# Patient Record
Sex: Female | Born: 1970
Health system: Southern US, Community
[De-identification: ages and names within clinical notes are randomized; demographics above are authoritative.]

---

## 1999-12-08 ENCOUNTER — Other Ambulatory Visit: Admission: RE | Admit: 1999-12-08 | Discharge: 1999-12-08 | Payer: Self-pay | Admitting: *Deleted

## 2000-06-26 ENCOUNTER — Inpatient Hospital Stay (HOSPITAL_COMMUNITY): Admission: AD | Admit: 2000-06-26 | Discharge: 2000-06-29 | Payer: Self-pay | Admitting: Obstetrics and Gynecology

## 2004-02-09 ENCOUNTER — Other Ambulatory Visit: Admission: RE | Admit: 2004-02-09 | Discharge: 2004-02-09 | Payer: Self-pay | Admitting: Obstetrics and Gynecology

## 2004-09-29 ENCOUNTER — Ambulatory Visit (HOSPITAL_COMMUNITY): Admission: RE | Admit: 2004-09-29 | Discharge: 2004-09-29 | Payer: Self-pay | Admitting: Obstetrics and Gynecology

## 2005-03-01 ENCOUNTER — Inpatient Hospital Stay (HOSPITAL_COMMUNITY): Admission: AD | Admit: 2005-03-01 | Discharge: 2005-03-03 | Payer: Self-pay | Admitting: Obstetrics and Gynecology

## 2005-04-06 ENCOUNTER — Other Ambulatory Visit: Admission: RE | Admit: 2005-04-06 | Discharge: 2005-04-06 | Payer: Self-pay | Admitting: Obstetrics and Gynecology

## 2006-04-09 ENCOUNTER — Other Ambulatory Visit: Admission: RE | Admit: 2006-04-09 | Discharge: 2006-04-09 | Payer: Self-pay | Admitting: Obstetrics and Gynecology

## 2007-05-14 ENCOUNTER — Other Ambulatory Visit: Admission: RE | Admit: 2007-05-14 | Discharge: 2007-05-14 | Payer: Self-pay | Admitting: *Deleted

## 2007-05-26 ENCOUNTER — Encounter: Admission: RE | Admit: 2007-05-26 | Discharge: 2007-05-26 | Payer: Self-pay | Admitting: *Deleted

## 2009-03-01 ENCOUNTER — Other Ambulatory Visit: Admission: RE | Admit: 2009-03-01 | Discharge: 2009-03-01 | Payer: Self-pay | Admitting: Family Medicine

## 2009-11-02 ENCOUNTER — Encounter
Admission: RE | Admit: 2009-11-02 | Discharge: 2009-11-02 | Payer: Self-pay | Source: Home / Self Care | Admitting: Family Medicine

## 2010-11-07 ENCOUNTER — Other Ambulatory Visit: Payer: Self-pay | Admitting: Family Medicine

## 2010-11-07 DIAGNOSIS — Z1231 Encounter for screening mammogram for malignant neoplasm of breast: Secondary | ICD-10-CM

## 2010-11-15 ENCOUNTER — Ambulatory Visit
Admission: RE | Admit: 2010-11-15 | Discharge: 2010-11-15 | Disposition: A | Discharge status: Home / Self Care | Payer: BC Managed Care – PPO | Source: Ambulatory Visit | Attending: Family Medicine | Admitting: Family Medicine

## 2010-11-15 DIAGNOSIS — Z1231 Encounter for screening mammogram for malignant neoplasm of breast: Secondary | ICD-10-CM

## 2011-02-16 NOTE — H&P (Signed)
[facility]  Patient:    Anna Howard, Anna Howard                         MRN: [id number] Adm. Date:  [id number] Attending:  Cleatrice Burke Dictator:   Vance Gather Duplantis, C.N.M.                         History and Physical  HISTORY OF PRESENT ILLNESS:   Anna Howard is a 40 year old married white female gravida I, para 0 at 38.[redacted] weeks gestation who presents complaining of contractions every two minutes for the last four hours and rupture of the membranes two hours prior to that.  She reports that the fluid was clear and she has had some bloody show.  She is currently complaining of an urge push as she arrived at the hospital.  Her pregnancy has been followed at [facility] by the Certified Nurse Midwife Service and has been essentially uncomplicated.  Her group B Strep is negative.  OB-GYN HISTORY:               She is a gravida I, para 0 with no Gyn complications.  GENERAL MEDICAL HISTORY:      She has no known drug allergies.  She reports having had the usual childhood diseases.  She reports and episode of a kidney infection as a child, but did not require hospitalization.  Her only surgery was to have her wisdom teeth removed.  FAMILY HISTORY:               Significant for father and mother with hypertension requiring medication.  Father with thyroidectomy at age 40 now on Synthroid.  Paternal uncle with liver cancer.  Maternal grandmother with Alzheimers.  GENETIC HISTORY:              Negative.  SOCIAL HISTORY:               She is married to [employer] who is involved and supportive.  They are of the [place] faith.  They deny any illicit drug use, alcohol or smoking with this pregnancy.  PRENATAL LABS:                Her blood type is A positive.  Her antibody screen is negative.  Syphilis is nonreactive.  Rubella is immune.  Hepatitis B surface antigen is negative.  HIV is nonreactive.  GC and Chlamydia are both negative.  Pap  is within normal limits.  Her one-hour Glucola was 66.  She declined a maternal serum alpha fetoprotein and her 36-week beta Strep was negative.  PHYSICAL EXAMINATION:  VITAL SIGNS:                  Stable.  She is afebrile.  HEENT:                        Grossly within normal limits.  HEART:                        Regular rhythm and rate.  CHEST:                        Clear.  BREASTS:                      Soft and nontender.  ABDOMEN:  Gravid with uterine contractions every two minutes.  Fetal heart rate is reassuring with some early decelerations.  PELVIC EXAM ON ADMISSION:     Eight centimeters, 90% effaced, vertex at a zero station.  EXTREMITIES:                  Within normal limits.  ASSESSMENT:                   1. Intrauterine pregnancy at term.                               2. Transition stage of labor.                               3. Negative group B Streptococcus.  PLAN:                         1. Admit to labor and delivery at [facility].                               2. Patient desires a nonintervention delivery                                  and labor.                               3. Notify Dr. Cleatrice Burke of patients                                  admission. DD:  06/27/00 TD:  06/27/00 Job: [id number]

## 2011-02-16 NOTE — H&P (Signed)
NAMEARIYEL, JEANGILLES                ACCOUNT NO.:  [id number]   MEDICAL RECORD NO.:  [id number]          PATIENT TYPE:  INP   LOCATION:  9111                          FACILITY:  [facility]   PHYSICIAN:  Naima A. Dillard, M.D. DATE OF BIRTH:  22-Apr-1971   DATE OF ADMISSION:  03/01/2005  DATE OF DISCHARGE:                                HISTORY & PHYSICAL   HISTORY OF PRESENT ILLNESS:  Ms. Anna Howard is a 40 year old married white  female, gravida 3, para 2-0-0-2 at 40-0/7 weeks who presents with leaking  fluids since midnight and regular contractions since then.  She denies  bleeding, headache, nausea, or visual disturbances.  Her pregnancy has been  followed by [facility] certified nurse midwife service and has  been remarkable for: 1) Advanced maternal age with no genetic testing.  2)  First trimester bleeding.  3) Group B strep negative.   Her prenatal labs were collected on August 09, 2004.  Hemoglobin 11.8,  hematocrit 35.3, platelets 223,000.  Blood type A positive.  Antibody  negative.  RPR nonreactive.  Rubella immune.  Hepatitis B surface antigen  negative.  HIV nonreactive.  Pap smear normal.  Gonorrhea negative.  Chlamydia negative.  One hour Glucola on December 01, 2004, was 86 and RPR at  that time was nonreactive.  Hemoglobin at that time was 10.6.  Culture of  the vaginal track for group B strep on January 19, 2005, was negative.   HISTORY OF PRESENT PREGNANCY:  The patient presented for care at [facility] on August 09, 2004, at 10-5/7 weeks' gestation.  The patient had  an episode of bleeding two days after that first visit.  Heart tones were  still audible in the 160s.  Pregnancy ultrasonography on October 26, 2004,  at [facility] in [place] showed growth consistent with previous  dating and confirmed and [facility] of March 01, 2005 with no anatomic abnormalities  seen.  She declined genetic testing.  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  Those lab results just  came back today.   PAST OBSTETRICAL HISTORY:  She is a gravida 3, para 2-0-0-2.  In September  2001, she had a vaginal delivery of a female infant, weighing 8 pounds at 56  weeks' gestation after six hours of labor.  His name is Chrissie Noa.  In July  2003, she had a vaginal delivery of a female infant weighing 7 pounds and 11  ounces at 40 weeks' gestation after six hours of labor.  She was exposed to  __________ in the first trimester.  That infant was born in [place].   ALLERGIES:  She has no medication allergies.   PAST MEDICAL HISTORY:  She experienced menarche at the age of 27 with 26 day  intervals lasting five days.  She reports having had the usual childhood  illnesses.  She had pyelonephritis in childhood one time.   FAMILY MEDICAL HISTORY:  Remarkable for mother and maternal grandmother with  varicosities.  Father with thyroid dysfunction.  Father with chronic renal  disease.  Maternal grandmother with Alzheimer's.  Maternal uncle with liver  and lung cancer.   PAST SURGICAL HISTORY:  Remarkable for wisdom teeth extraction in 1994.   GENETIC HISTORY:  Remarkable for patient being at the age of 60.  It is  otherwise negative.   SOCIAL HISTORY:  The patient is married to the father of the baby.  His name  is Elige Radon.  He is involved and supportive.  There are of the [place]  faith.  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  Father of  the baby has a Education administrator and is employed in Airline pilot at [employer].  They  deny any alcohol, tobacco or illicit drug use with this pregnancy.   PHYSICAL EXAMINATION:  VITAL SIGNS:  Stable.  She is afebrile.  HEENT:  Grossly within normal limits.  CHEST:  Clear to auscultation.  HEART:  Regular rate and rhythm.  ABDOMEN:  Gravid in contour with fundal height extending approximately 40 cm  up to the pubic  symphysis.  Fetal heart rate is reassuring with positive  accelerations and no decelerations.  Negative CST.  Contractions every 3-4  minutes.  PELVIC:  Cervix is 5-6 cm, 80%, vertex -1 on exam with clear fluid present.  EXTREMITIES:  Normal.   IMPRESSION:  1.  Intrauterine pregnancy at term.  2.  Active labor.  3.  Group B strep negative.   PLAN:  1.  Admit to birthing suite.  Dr. Normand Sloop has been notified.  2.  Routine CNM orders.  3.  Patient declined pain medication.  4.  Anticipate normal spontaneous vaginal birth.       KS/MEDQ  D:  03/01/2005  T:  03/01/2005  Job:  [id number]

## 2011-07-25 ENCOUNTER — Other Ambulatory Visit (HOSPITAL_COMMUNITY)
Admission: RE | Admit: 2011-07-25 | Discharge: 2011-07-25 | Disposition: A | Discharge status: Home / Self Care | Payer: BC Managed Care – PPO | Source: Ambulatory Visit | Attending: Family Medicine | Admitting: Family Medicine

## 2011-07-25 ENCOUNTER — Other Ambulatory Visit: Payer: Self-pay | Admitting: Physician Assistant

## 2011-07-25 DIAGNOSIS — Z01419 Encounter for gynecological examination (general) (routine) without abnormal findings: Secondary | ICD-10-CM | POA: Insufficient documentation

## 2011-10-16 ENCOUNTER — Other Ambulatory Visit: Payer: Self-pay | Admitting: Family Medicine

## 2011-10-16 DIAGNOSIS — Z1231 Encounter for screening mammogram for malignant neoplasm of breast: Secondary | ICD-10-CM

## 2011-11-19 ENCOUNTER — Institutional Professional Consult (permissible substitution): Payer: BC Managed Care – PPO | Admitting: Family Medicine

## 2011-11-19 ENCOUNTER — Ambulatory Visit
Admission: RE | Admit: 2011-11-19 | Discharge: 2011-11-19 | Disposition: A | Discharge status: Home / Self Care | Payer: PRIVATE HEALTH INSURANCE | Source: Ambulatory Visit | Attending: Family Medicine | Admitting: Family Medicine

## 2011-11-19 ENCOUNTER — Ambulatory Visit: Payer: BC Managed Care – PPO | Admitting: Family Medicine

## 2011-11-19 ENCOUNTER — Other Ambulatory Visit: Payer: Self-pay | Admitting: Family Medicine

## 2011-11-19 DIAGNOSIS — Z1231 Encounter for screening mammogram for malignant neoplasm of breast: Secondary | ICD-10-CM

## 2012-08-14 ENCOUNTER — Other Ambulatory Visit: Payer: Self-pay | Admitting: Family Medicine

## 2012-08-14 ENCOUNTER — Other Ambulatory Visit (HOSPITAL_COMMUNITY)
Admission: RE | Admit: 2012-08-14 | Discharge: 2012-08-14 | Disposition: A | Discharge status: Home / Self Care | Payer: PRIVATE HEALTH INSURANCE | Source: Ambulatory Visit | Attending: Family Medicine | Admitting: Family Medicine

## 2012-08-14 DIAGNOSIS — Z01419 Encounter for gynecological examination (general) (routine) without abnormal findings: Secondary | ICD-10-CM | POA: Insufficient documentation

## 2012-11-27 ENCOUNTER — Other Ambulatory Visit: Payer: Self-pay

## 2012-12-22 ENCOUNTER — Ambulatory Visit
Admission: RE | Admit: 2012-12-22 | Discharge: 2012-12-22 | Disposition: A | Discharge status: Home / Self Care | Payer: PRIVATE HEALTH INSURANCE | Source: Ambulatory Visit

## 2012-12-22 DIAGNOSIS — Z1231 Encounter for screening mammogram for malignant neoplasm of breast: Secondary | ICD-10-CM

## 2013-12-01 ENCOUNTER — Other Ambulatory Visit: Payer: Self-pay

## 2013-12-01 DIAGNOSIS — Z1231 Encounter for screening mammogram for malignant neoplasm of breast: Secondary | ICD-10-CM

## 2013-12-23 ENCOUNTER — Ambulatory Visit
Admission: RE | Admit: 2013-12-23 | Discharge: 2013-12-23 | Disposition: A | Discharge status: Home / Self Care | Payer: PRIVATE HEALTH INSURANCE | Source: Ambulatory Visit

## 2013-12-23 DIAGNOSIS — Z1231 Encounter for screening mammogram for malignant neoplasm of breast: Secondary | ICD-10-CM

## 2013-12-24 ENCOUNTER — Ambulatory Visit: Payer: PRIVATE HEALTH INSURANCE

## 2013-12-30 ENCOUNTER — Other Ambulatory Visit: Payer: Self-pay | Admitting: Family Medicine

## 2013-12-30 DIAGNOSIS — R928 Other abnormal and inconclusive findings on diagnostic imaging of breast: Secondary | ICD-10-CM

## 2014-01-07 ENCOUNTER — Ambulatory Visit
Admission: RE | Admit: 2014-01-07 | Discharge: 2014-01-07 | Disposition: A | Discharge status: Home / Self Care | Payer: Self-pay | Source: Ambulatory Visit | Attending: Family Medicine | Admitting: Family Medicine

## 2014-01-07 ENCOUNTER — Ambulatory Visit
Admission: RE | Admit: 2014-01-07 | Discharge: 2014-01-07 | Disposition: A | Discharge status: Home / Self Care | Payer: PRIVATE HEALTH INSURANCE | Source: Ambulatory Visit | Attending: Family Medicine | Admitting: Family Medicine

## 2014-01-07 DIAGNOSIS — R928 Other abnormal and inconclusive findings on diagnostic imaging of breast: Secondary | ICD-10-CM

## 2015-02-01 ENCOUNTER — Other Ambulatory Visit: Payer: Self-pay

## 2015-02-01 DIAGNOSIS — Z1231 Encounter for screening mammogram for malignant neoplasm of breast: Secondary | ICD-10-CM

## 2015-02-17 ENCOUNTER — Ambulatory Visit
Admission: RE | Admit: 2015-02-17 | Discharge: 2015-02-17 | Disposition: A | Discharge status: Home / Self Care | Payer: PRIVATE HEALTH INSURANCE | Source: Ambulatory Visit

## 2015-02-17 DIAGNOSIS — Z1231 Encounter for screening mammogram for malignant neoplasm of breast: Secondary | ICD-10-CM

## 2015-07-18 ENCOUNTER — Other Ambulatory Visit (HOSPITAL_COMMUNITY)
Admission: RE | Admit: 2015-07-18 | Discharge: 2015-07-18 | Disposition: A | Discharge status: Home / Self Care | Payer: PRIVATE HEALTH INSURANCE | Source: Ambulatory Visit | Attending: Family Medicine | Admitting: Family Medicine

## 2015-07-18 ENCOUNTER — Other Ambulatory Visit: Payer: Self-pay | Admitting: Family Medicine

## 2015-07-18 DIAGNOSIS — Z01419 Encounter for gynecological examination (general) (routine) without abnormal findings: Secondary | ICD-10-CM | POA: Insufficient documentation

## 2015-07-20 LAB — CYTOLOGY - PAP

## 2016-02-07 ENCOUNTER — Other Ambulatory Visit: Payer: Self-pay

## 2016-02-07 DIAGNOSIS — Z1231 Encounter for screening mammogram for malignant neoplasm of breast: Secondary | ICD-10-CM

## 2016-02-22 ENCOUNTER — Ambulatory Visit
Admission: RE | Admit: 2016-02-22 | Discharge: 2016-02-22 | Disposition: A | Discharge status: Home / Self Care | Payer: PRIVATE HEALTH INSURANCE | Source: Ambulatory Visit

## 2016-02-22 DIAGNOSIS — Z1231 Encounter for screening mammogram for malignant neoplasm of breast: Secondary | ICD-10-CM

## 2017-01-29 ENCOUNTER — Other Ambulatory Visit: Payer: Self-pay | Admitting: Family Medicine

## 2017-01-29 DIAGNOSIS — Z1231 Encounter for screening mammogram for malignant neoplasm of breast: Secondary | ICD-10-CM

## 2017-02-22 ENCOUNTER — Ambulatory Visit
Admission: RE | Admit: 2017-02-22 | Discharge: 2017-02-22 | Disposition: A | Discharge status: Home / Self Care | Payer: PRIVATE HEALTH INSURANCE | Source: Ambulatory Visit | Attending: Family Medicine | Admitting: Family Medicine

## 2017-02-22 DIAGNOSIS — Z1231 Encounter for screening mammogram for malignant neoplasm of breast: Secondary | ICD-10-CM

## 2018-01-15 ENCOUNTER — Other Ambulatory Visit: Payer: Self-pay | Admitting: Family Medicine

## 2018-01-15 DIAGNOSIS — Z1231 Encounter for screening mammogram for malignant neoplasm of breast: Secondary | ICD-10-CM

## 2018-02-25 ENCOUNTER — Ambulatory Visit
Admission: RE | Admit: 2018-02-25 | Discharge: 2018-02-25 | Disposition: A | Discharge status: Home / Self Care | Payer: Self-pay | Source: Ambulatory Visit | Attending: Family Medicine | Admitting: Family Medicine

## 2018-02-25 DIAGNOSIS — Z1231 Encounter for screening mammogram for malignant neoplasm of breast: Secondary | ICD-10-CM | POA: Diagnosis not present

## 2018-04-30 DIAGNOSIS — D225 Melanocytic nevi of trunk: Secondary | ICD-10-CM | POA: Diagnosis not present

## 2018-04-30 DIAGNOSIS — L509 Urticaria, unspecified: Secondary | ICD-10-CM | POA: Diagnosis not present

## 2018-04-30 DIAGNOSIS — L918 Other hypertrophic disorders of the skin: Secondary | ICD-10-CM | POA: Diagnosis not present

## 2018-04-30 DIAGNOSIS — L814 Other melanin hyperpigmentation: Secondary | ICD-10-CM | POA: Diagnosis not present

## 2018-05-30 DIAGNOSIS — H52201 Unspecified astigmatism, right eye: Secondary | ICD-10-CM | POA: Diagnosis not present

## 2018-05-30 DIAGNOSIS — H5212 Myopia, left eye: Secondary | ICD-10-CM | POA: Diagnosis not present

## 2018-07-03 ENCOUNTER — Other Ambulatory Visit: Payer: Self-pay | Admitting: Family Medicine

## 2018-07-03 ENCOUNTER — Other Ambulatory Visit (HOSPITAL_COMMUNITY)
Admission: RE | Admit: 2018-07-03 | Discharge: 2018-07-03 | Disposition: A | Discharge status: Home / Self Care | Payer: BLUE CROSS/BLUE SHIELD | Source: Ambulatory Visit | Attending: Family Medicine | Admitting: Family Medicine

## 2018-07-03 DIAGNOSIS — N951 Menopausal and female climacteric states: Secondary | ICD-10-CM | POA: Diagnosis not present

## 2018-07-03 DIAGNOSIS — Z01419 Encounter for gynecological examination (general) (routine) without abnormal findings: Secondary | ICD-10-CM | POA: Diagnosis not present

## 2018-07-03 DIAGNOSIS — Z23 Encounter for immunization: Secondary | ICD-10-CM | POA: Diagnosis not present

## 2018-07-03 DIAGNOSIS — M549 Dorsalgia, unspecified: Secondary | ICD-10-CM | POA: Diagnosis not present

## 2018-07-03 DIAGNOSIS — E7800 Pure hypercholesterolemia, unspecified: Secondary | ICD-10-CM | POA: Diagnosis not present

## 2018-07-03 DIAGNOSIS — Z124 Encounter for screening for malignant neoplasm of cervix: Secondary | ICD-10-CM | POA: Diagnosis not present

## 2018-07-03 DIAGNOSIS — Z0000 Encounter for general adult medical examination without abnormal findings: Secondary | ICD-10-CM | POA: Diagnosis not present

## 2018-07-04 LAB — CYTOLOGY - PAP: Diagnosis: NEGATIVE

## 2018-07-24 ENCOUNTER — Ambulatory Visit (INDEPENDENT_AMBULATORY_CARE_PROVIDER_SITE_OTHER): Payer: BLUE CROSS/BLUE SHIELD | Admitting: Podiatry

## 2018-07-24 ENCOUNTER — Encounter: Payer: Self-pay | Admitting: Podiatry

## 2018-07-24 ENCOUNTER — Ambulatory Visit: Payer: BLUE CROSS/BLUE SHIELD

## 2018-07-24 VITALS — BP 107/61 | HR 70

## 2018-07-24 DIAGNOSIS — M779 Enthesopathy, unspecified: Secondary | ICD-10-CM

## 2018-07-24 DIAGNOSIS — M79672 Pain in left foot: Secondary | ICD-10-CM

## 2018-07-24 NOTE — Progress Notes (Signed)
Subjective:   Patient ID: Anna Howard, female   DOB: 47 y.o.   MRN: [id number]   HPI Patient states the pain is not truly in the foot but she is had trouble with her hip but she does get generalized pain at times in her feet with a high arch foot structure.  States that she is been diagnosed with significant limb length discrepancy and is wearing a very large lift in the left heel and wants to know if she still needs to wear it.  Patient does not smoke likes to be active   Review of Systems  All other systems reviewed and are negative.       Objective:  Physical Exam  Constitutional: She appears well-developed and well-nourished.  Cardiovascular: Intact distal pulses.  Pulmonary/Chest: Effort normal.  Musculoskeletal: Normal range of motion.  Neurological: She is alert.  Skin: Skin is warm.  Nursing note and vitals reviewed.   Neurovascular status intact muscle strength is adequate range of motion within normal limits with patient noted to have limb length discrepancy with the left leg approximately one quarter of an inch shorter than the left with patient having relative cavus foot structure.  Patient is found to have good digital perfusion well oriented x3 with just mild foot pain noted     Assessment:  Patient may have low-grade tendinitis but does have a very mild limb length discrepancy based on clinical measurements     Plan:  H&P condition reviewed and at this point I have recommended reducing the heel lift to 1/4 inch and then gradually less and if her back continues to feel good she does not need to wear heel lift anymore.  If it seems she does better with the lift she may need to stay in it but at this point I do not think it is necessary.  Patient will be seen back for [place] to recheck as needed

## 2018-08-14 DIAGNOSIS — H0015 Chalazion left lower eyelid: Secondary | ICD-10-CM | POA: Diagnosis not present

## 2019-02-27 ENCOUNTER — Other Ambulatory Visit: Payer: Self-pay | Admitting: Family Medicine

## 2019-02-27 DIAGNOSIS — Z1231 Encounter for screening mammogram for malignant neoplasm of breast: Secondary | ICD-10-CM

## 2019-04-16 ENCOUNTER — Ambulatory Visit
Admission: RE | Admit: 2019-04-16 | Discharge: 2019-04-16 | Disposition: A | Discharge status: Home / Self Care | Payer: BC Managed Care – PPO | Source: Ambulatory Visit | Attending: Family Medicine | Admitting: Family Medicine

## 2019-04-16 DIAGNOSIS — Z1231 Encounter for screening mammogram for malignant neoplasm of breast: Secondary | ICD-10-CM

## 2020-01-12 DIAGNOSIS — L814 Other melanin hyperpigmentation: Secondary | ICD-10-CM | POA: Diagnosis not present

## 2020-01-12 DIAGNOSIS — L820 Inflamed seborrheic keratosis: Secondary | ICD-10-CM | POA: Diagnosis not present

## 2020-01-12 DIAGNOSIS — L720 Epidermal cyst: Secondary | ICD-10-CM | POA: Diagnosis not present

## 2020-01-25 DIAGNOSIS — E7800 Pure hypercholesterolemia, unspecified: Secondary | ICD-10-CM | POA: Diagnosis not present

## 2020-01-25 DIAGNOSIS — Z131 Encounter for screening for diabetes mellitus: Secondary | ICD-10-CM | POA: Diagnosis not present

## 2020-01-25 DIAGNOSIS — Z0000 Encounter for general adult medical examination without abnormal findings: Secondary | ICD-10-CM | POA: Diagnosis not present

## 2020-03-02 DIAGNOSIS — Z20822 Contact with and (suspected) exposure to covid-19: Secondary | ICD-10-CM | POA: Diagnosis not present

## 2020-05-19 DIAGNOSIS — Z20822 Contact with and (suspected) exposure to covid-19: Secondary | ICD-10-CM | POA: Diagnosis not present

## 2020-05-19 DIAGNOSIS — R509 Fever, unspecified: Secondary | ICD-10-CM | POA: Diagnosis not present

## 2020-05-21 ENCOUNTER — Emergency Department (HOSPITAL_BASED_OUTPATIENT_CLINIC_OR_DEPARTMENT_OTHER): Payer: BC Managed Care – PPO

## 2020-05-21 ENCOUNTER — Other Ambulatory Visit: Payer: Self-pay

## 2020-05-21 ENCOUNTER — Emergency Department (HOSPITAL_BASED_OUTPATIENT_CLINIC_OR_DEPARTMENT_OTHER)
Admission: EM | Admit: 2020-05-21 | Discharge: 2020-05-21 | Disposition: A | Discharge status: Home / Self Care | Payer: BC Managed Care – PPO | Attending: Emergency Medicine | Admitting: Emergency Medicine

## 2020-05-21 ENCOUNTER — Encounter (HOSPITAL_BASED_OUTPATIENT_CLINIC_OR_DEPARTMENT_OTHER): Payer: Self-pay | Admitting: Emergency Medicine

## 2020-05-21 DIAGNOSIS — R05 Cough: Secondary | ICD-10-CM | POA: Diagnosis not present

## 2020-05-21 DIAGNOSIS — J9811 Atelectasis: Secondary | ICD-10-CM | POA: Diagnosis not present

## 2020-05-21 DIAGNOSIS — U071 COVID-19: Secondary | ICD-10-CM | POA: Insufficient documentation

## 2020-05-21 LAB — CBC WITH DIFFERENTIAL/PLATELET
Abs Immature Granulocytes: 0.01 10*3/uL (ref 0.00–0.07)
Basophils Absolute: 0 10*3/uL (ref 0.0–0.1)
Basophils Relative: 0 %
Eosinophils Absolute: 0 10*3/uL (ref 0.0–0.5)
Eosinophils Relative: 0 %
HCT: 40.3 % (ref 36.0–46.0)
Hemoglobin: 13.7 g/dL (ref 12.0–15.0)
Immature Granulocytes: 0 %
Lymphocytes Relative: 26 %
Lymphs Abs: 0.8 10*3/uL (ref 0.7–4.0)
MCH: 31.9 pg (ref 26.0–34.0)
MCHC: 34 g/dL (ref 30.0–36.0)
MCV: 93.7 fL (ref 80.0–100.0)
Monocytes Absolute: 0.2 10*3/uL (ref 0.1–1.0)
Monocytes Relative: 7 %
Neutro Abs: 2 10*3/uL (ref 1.7–7.7)
Neutrophils Relative %: 67 %
Platelets: 175 10*3/uL (ref 150–400)
RBC: 4.3 MIL/uL (ref 3.87–5.11)
RDW: 12.8 % (ref 11.5–15.5)
WBC: 3.1 10*3/uL — ABNORMAL LOW (ref 4.0–10.5)
nRBC: 0 % (ref 0.0–0.2)

## 2020-05-21 LAB — BASIC METABOLIC PANEL
Anion gap: 11 (ref 5–15)
BUN: 25 mg/dL — ABNORMAL HIGH (ref 6–20)
CO2: 26 mmol/L (ref 22–32)
Calcium: 8.4 mg/dL — ABNORMAL LOW (ref 8.9–10.3)
Chloride: 94 mmol/L — ABNORMAL LOW (ref 98–111)
Creatinine, Ser: 1.11 mg/dL — ABNORMAL HIGH (ref 0.44–1.00)
GFR calc Af Amer: >60 mL/min (ref >60)
GFR calc non Af Amer: 58 mL/min — ABNORMAL LOW (ref >60)
Glucose, Bld: 97 mg/dL (ref 70–99)
Potassium: 4 mmol/L (ref 3.5–5.1)
Sodium: 131 mmol/L — ABNORMAL LOW (ref 135–145)

## 2020-05-21 MED ORDER — SODIUM CHLORIDE 0.9 % IV BOLUS
1000.0000 mL | Freq: Once | INTRAVENOUS | Status: AC
  Administered 2020-05-21: 1000 mL via INTRAVENOUS

## 2020-05-21 MED ORDER — ONDANSETRON HCL 4 MG/2ML IJ SOLN
4.0000 mg | Freq: Once | INTRAMUSCULAR | Status: AC
  Administered 2020-05-21: 4 mg via INTRAVENOUS
  Filled 2020-05-21: qty 2

## 2020-05-21 MED ORDER — ONDANSETRON HCL 4 MG PO TABS: 4.0000 mg | ORAL_TABLET | Freq: Three times a day (TID) | ORAL | 0 refills | Status: AC | PRN

## 2020-05-21 NOTE — ED Triage Notes (Signed)
COVID+, c/o emesis, fever and mild SOB

## 2020-05-21 NOTE — ED Provider Notes (Signed)
[facility] EMERGENCY DEPARTMENT Provider Note   CSN: [id number] Arrival date & time: 05/21/20  5284     History Chief Complaint  Patient presents with  . Fever    COVID+  . Emesis    Anna Howard is a 49 y.o. female.  Patient diagnosed with Covid about 10 days ago.  Having some nausea, weakness.  The history is provided by the patient.  Emesis Associated symptoms: cough, fever, myalgias and URI   Associated symptoms: no abdominal pain, no arthralgias, no chills and no sore throat   URI Presenting symptoms: cough and fever   Presenting symptoms: no ear pain and no sore throat   Severity:  Mild Onset quality:  Gradual Timing:  Intermittent Progression:  Waxing and waning Chronicity:  New Relieved by:  Nothing Worsened by:  Nothing Associated symptoms: myalgias   Associated symptoms: no arthralgias        History reviewed. No pertinent past medical history.  There are no problems to display for this patient.   History reviewed. No pertinent surgical history.   OB History   No obstetric history on file.     No family history on file.  Social History   Tobacco Use  . Smoking status: Never Smoker  . Smokeless tobacco: Never Used  Substance Use Topics  . Alcohol use: Not on file  . Drug use: Not on file    Home Medications Prior to Admission medications   Medication Sig Start Date End Date Taking? Authorizing Provider  atorvastatin (LIPITOR) 40 MG tablet Take 40 mg by mouth daily.   Yes [provider]  ondansetron (ZOFRAN) 4 MG tablet Take 1 tablet (4 mg total) by mouth every 8 (eight) hours as needed for up to 15 days for nausea or vomiting. 05/21/20 06/05/20  Virgina Norfolk, DO    Allergies    Patient has no known allergies.  Review of Systems   Review of Systems  Constitutional: Positive for fever. Negative for chills.  HENT: Negative for ear pain and sore throat.   Eyes: Negative for pain and visual disturbance.    Respiratory: Positive for cough. Negative for shortness of breath.   Cardiovascular: Negative for chest pain and palpitations.  Gastrointestinal: Negative for abdominal pain and vomiting.  Genitourinary: Negative for dysuria and hematuria.  Musculoskeletal: Positive for myalgias. Negative for arthralgias and back pain.  Skin: Negative for color change and rash.  Neurological: Positive for weakness. Negative for seizures and syncope.  All other systems reviewed and are negative.   Physical Exam Updated Vital Signs BP 104/69 (BP Location: Left Arm)   Pulse 81   Temp 98.1 F (36.7 C) (Oral)   Resp 20   Ht 5\' 6"  (1.676 m)   Wt 68 kg   SpO2 90%   BMI 24.21 kg/m   Physical Exam Vitals and nursing note reviewed.  Constitutional:      General: She is not in acute distress.    Appearance: She is well-developed. She is ill-appearing.  HENT:     Head: Normocephalic and atraumatic.     Mouth/Throat:     Mouth: Mucous membranes are moist.  Eyes:     Extraocular Movements: Extraocular movements intact.     Conjunctiva/sclera: Conjunctivae normal.     Pupils: Pupils are equal, round, and reactive to light.  Cardiovascular:     Rate and Rhythm: Normal rate and regular rhythm.     Pulses: Normal pulses.     Heart sounds: Normal  heart sounds. No murmur heard.   Pulmonary:     Effort: Pulmonary effort is normal. No respiratory distress.     Breath sounds: Normal breath sounds.  Abdominal:     Palpations: Abdomen is soft.     Tenderness: There is no abdominal tenderness.  Musculoskeletal:     Cervical back: Normal range of motion and neck supple.  Skin:    General: Skin is warm and dry.     Capillary Refill: Capillary refill takes less than 2 seconds.  Neurological:     General: No focal deficit present.     Mental Status: She is alert.     ED Results / Procedures / Treatments   Labs (all labs ordered are listed, but only abnormal results are displayed) Labs Reviewed  CBC  WITH DIFFERENTIAL/PLATELET - Abnormal; Notable for the following components:      Result Value   WBC 3.1 (*)    All other components within normal limits  BASIC METABOLIC PANEL - Abnormal; Notable for the following components:   Sodium 131 (*)    Chloride 94 (*)    BUN 25 (*)    Creatinine, Ser 1.11 (*)    Calcium 8.4 (*)    GFR calc non Af Amer 58 (*)    All other components within normal limits    EKG None  Radiology DG Chest Portable 1 View  Result Date: 05/21/2020 CLINICAL DATA:  COVID-19. EXAM: PORTABLE CHEST 1 VIEW COMPARISON:  None. FINDINGS: Minimal opacity in left base may be atelectasis or very subtle infiltrate. The heart, hila, mediastinum, lungs, and pleura are otherwise normal. IMPRESSION: Minimal opacity in left base may simply represent atelectasis. Very early subtle infiltrate not excluded. Recommend attention on follow-up. No other acute abnormalities. Electronically Signed   By: Gerome Sam III M.D   On: 05/21/2020 10:33    Procedures Procedures (including critical care time)  Medications Ordered in ED Medications  sodium chloride 0.9 % bolus 1,000 mL (1,000 mLs Intravenous New Bag/Given 05/21/20 1041)  ondansetron (ZOFRAN) injection 4 mg (4 mg Intravenous Given 05/21/20 1038)    ED Course  I have reviewed the triage vital signs and the nursing notes.  Pertinent labs & imaging results that were available during my care of the patient were reviewed by me and considered in my medical decision making (see chart for details).    MDM Rules/Calculators/A&P                          Anna Howard is a 49 year old female with no significant medical history.  Here with emesis and fever and Covid symptoms.  Normal vitals.  No fever.  Normal room air oxygenation.  Oxygenation while ambulating is also normal.  No increased work of breathing.  Overall has had nausea and decreased appetite.  Lab work showed no significant anemia, electrolyte abnormality, kidney injury.   Chest x-ray was overall unremarkable.  She has no signs of major systemic symptoms.  Mostly needs symptomatic care.  Recommend continued use of Tylenol Motrin.  Will prescribe Zofran.  Will message [facility] infusion [facility] to see if she is a possible candidate.  Discharged in good condition.  This chart was dictated using voice recognition software.  Despite best efforts to proofread,  errors can occur which can change the documentation meaning.   Anna Howard was evaluated in Emergency Department on 05/21/2020 for the symptoms described in the history of present illness. She was  evaluated in the context of the global COVID-19 pandemic, which necessitated consideration that the patient might be at risk for infection with the SARS-CoV-2 virus that causes COVID-19. Institutional protocols and algorithms that pertain to the evaluation of patients at risk for COVID-19 are in a state of rapid change based on information released by regulatory bodies including the CDC and federal and state organizations. These policies and algorithms were followed during the patient's care in the ED.   Final Clinical Impression(s) / ED Diagnoses Final diagnoses:  COVID-19    Rx / DC Orders ED Discharge Orders         Ordered    ondansetron (ZOFRAN) 4 MG tablet  Every 8 hours PRN        05/21/20 1136           Virgina Norfolk, DO 05/21/20 1138

## 2020-05-21 NOTE — ED Notes (Signed)
SpO2 91% on r/a prior to triage.

## 2020-05-21 NOTE — Discharge Instructions (Signed)
Your lab work and chest x-ray today were overall unremarkable.  Continue hydration and rest.  Take Zofran as needed for nausea.  Please return if you develop any worsening symptoms.

## 2020-05-21 NOTE — ED Notes (Signed)
XR at bedside

## 2020-05-30 ENCOUNTER — Other Ambulatory Visit: Payer: Self-pay | Admitting: Family Medicine

## 2020-05-30 DIAGNOSIS — Z1231 Encounter for screening mammogram for malignant neoplasm of breast: Secondary | ICD-10-CM

## 2020-06-29 ENCOUNTER — Other Ambulatory Visit: Payer: Self-pay

## 2020-06-29 ENCOUNTER — Ambulatory Visit
Admission: RE | Admit: 2020-06-29 | Discharge: 2020-06-29 | Disposition: A | Discharge status: Home / Self Care | Payer: BC Managed Care – PPO | Source: Ambulatory Visit | Attending: Family Medicine | Admitting: Family Medicine

## 2020-06-29 DIAGNOSIS — Z1231 Encounter for screening mammogram for malignant neoplasm of breast: Secondary | ICD-10-CM

## 2020-11-03 DIAGNOSIS — H0012 Chalazion right lower eyelid: Secondary | ICD-10-CM | POA: Diagnosis not present

## 2020-11-25 DIAGNOSIS — L239 Allergic contact dermatitis, unspecified cause: Secondary | ICD-10-CM | POA: Diagnosis not present

## 2021-02-23 DIAGNOSIS — E7800 Pure hypercholesterolemia, unspecified: Secondary | ICD-10-CM | POA: Diagnosis not present

## 2021-02-23 DIAGNOSIS — Z124 Encounter for screening for malignant neoplasm of cervix: Secondary | ICD-10-CM | POA: Diagnosis not present

## 2021-02-23 DIAGNOSIS — Z0000 Encounter for general adult medical examination without abnormal findings: Secondary | ICD-10-CM | POA: Diagnosis not present

## 2021-02-23 DIAGNOSIS — Z8349 Family history of other endocrine, nutritional and metabolic diseases: Secondary | ICD-10-CM | POA: Diagnosis not present

## 2021-05-31 ENCOUNTER — Other Ambulatory Visit: Payer: Self-pay | Admitting: Family Medicine

## 2021-05-31 DIAGNOSIS — Z1231 Encounter for screening mammogram for malignant neoplasm of breast: Secondary | ICD-10-CM

## 2021-07-07 ENCOUNTER — Other Ambulatory Visit: Payer: Self-pay

## 2021-07-07 ENCOUNTER — Ambulatory Visit
Admission: RE | Admit: 2021-07-07 | Discharge: 2021-07-07 | Disposition: A | Discharge status: Home / Self Care | Payer: BC Managed Care – PPO | Source: Ambulatory Visit | Attending: Family Medicine | Admitting: Family Medicine

## 2021-07-07 DIAGNOSIS — Z1231 Encounter for screening mammogram for malignant neoplasm of breast: Secondary | ICD-10-CM | POA: Diagnosis not present

## 2021-11-07 DIAGNOSIS — Z1211 Encounter for screening for malignant neoplasm of colon: Secondary | ICD-10-CM | POA: Diagnosis not present

## 2021-11-07 DIAGNOSIS — K648 Other hemorrhoids: Secondary | ICD-10-CM | POA: Diagnosis not present

## 2021-11-07 DIAGNOSIS — K644 Residual hemorrhoidal skin tags: Secondary | ICD-10-CM | POA: Diagnosis not present

## 2021-11-20 DIAGNOSIS — B029 Zoster without complications: Secondary | ICD-10-CM | POA: Diagnosis not present

## 2021-11-20 DIAGNOSIS — L570 Actinic keratosis: Secondary | ICD-10-CM | POA: Diagnosis not present

## 2022-03-09 DIAGNOSIS — Z0000 Encounter for general adult medical examination without abnormal findings: Secondary | ICD-10-CM | POA: Diagnosis not present

## 2022-03-09 DIAGNOSIS — E7800 Pure hypercholesterolemia, unspecified: Secondary | ICD-10-CM | POA: Diagnosis not present

## 2022-03-26 DIAGNOSIS — L570 Actinic keratosis: Secondary | ICD-10-CM | POA: Diagnosis not present

## 2022-06-18 ENCOUNTER — Other Ambulatory Visit: Payer: Self-pay | Admitting: Family Medicine

## 2022-06-18 ENCOUNTER — Other Ambulatory Visit: Payer: Self-pay | Admitting: Internal Medicine

## 2022-06-18 DIAGNOSIS — Z1231 Encounter for screening mammogram for malignant neoplasm of breast: Secondary | ICD-10-CM

## 2022-07-10 ENCOUNTER — Ambulatory Visit
Admission: RE | Admit: 2022-07-10 | Discharge: 2022-07-10 | Disposition: A | Discharge status: Home / Self Care | Payer: BC Managed Care – PPO | Source: Ambulatory Visit | Attending: Internal Medicine | Admitting: Internal Medicine

## 2022-07-10 ENCOUNTER — Ambulatory Visit: Payer: BC Managed Care – PPO

## 2022-07-10 DIAGNOSIS — Z1231 Encounter for screening mammogram for malignant neoplasm of breast: Secondary | ICD-10-CM

## 2022-09-12 IMAGING — MG MM DIGITAL SCREENING BILAT W/ TOMO AND CAD
8 series · 8 of 24 positions shown · non-contrast
Comparison: Previous exam(s).

CLINICAL DATA: Screening.

EXAM:
DIGITAL SCREENING BILATERAL MAMMOGRAM WITH TOMOSYNTHESIS AND CAD
TECHNIQUE: Bilateral screening digital craniocaudal and mediolateral oblique
mammograms were obtained. Bilateral screening digital breast
tomosynthesis was performed. The images were evaluated with
computer-aided detection.

[L MLO synth-2D]
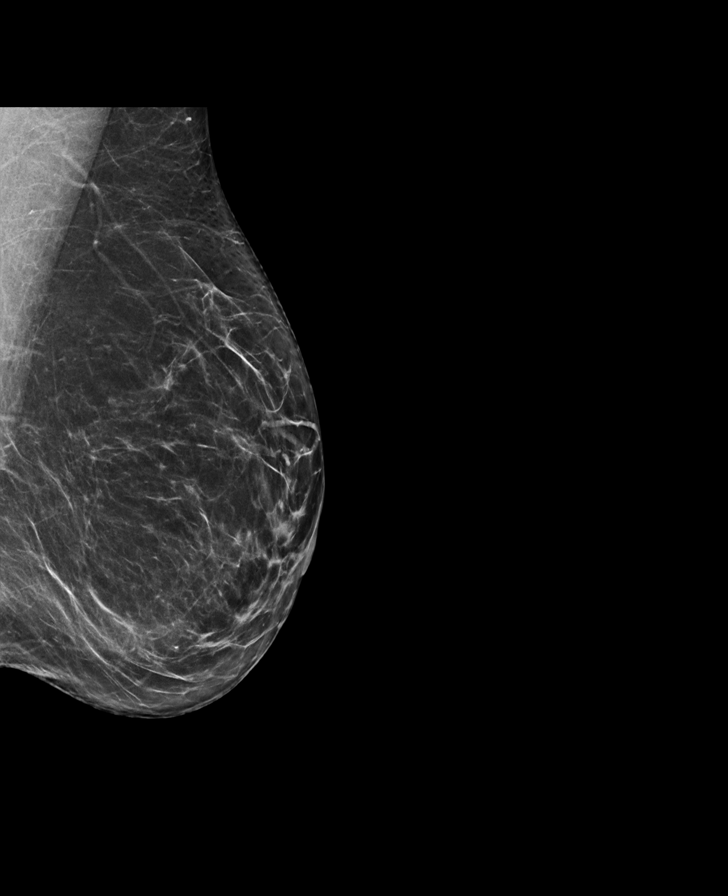

[L CC synth-2D]
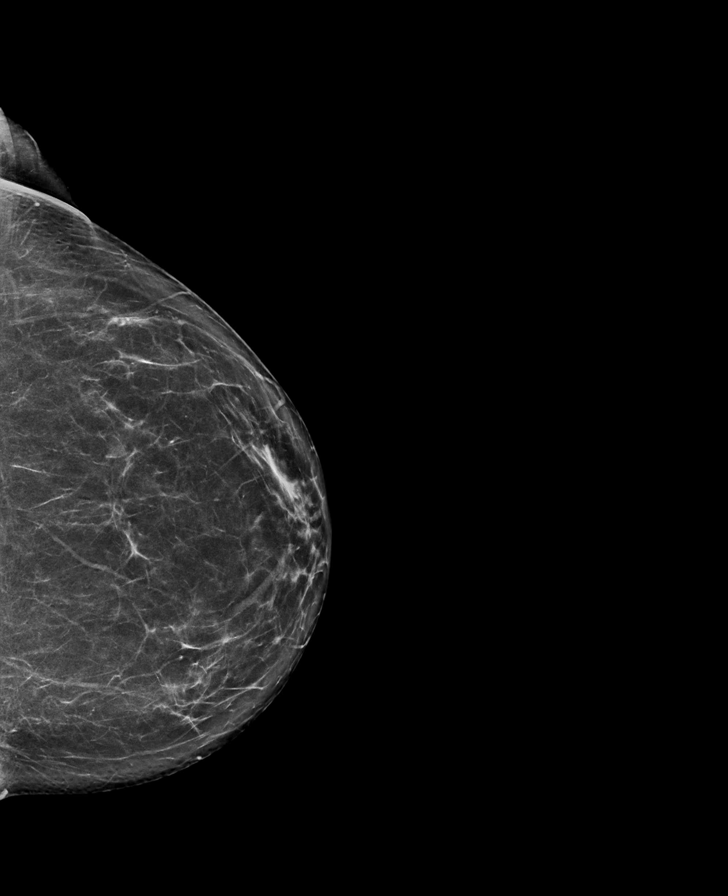

[R CC synth-2D]
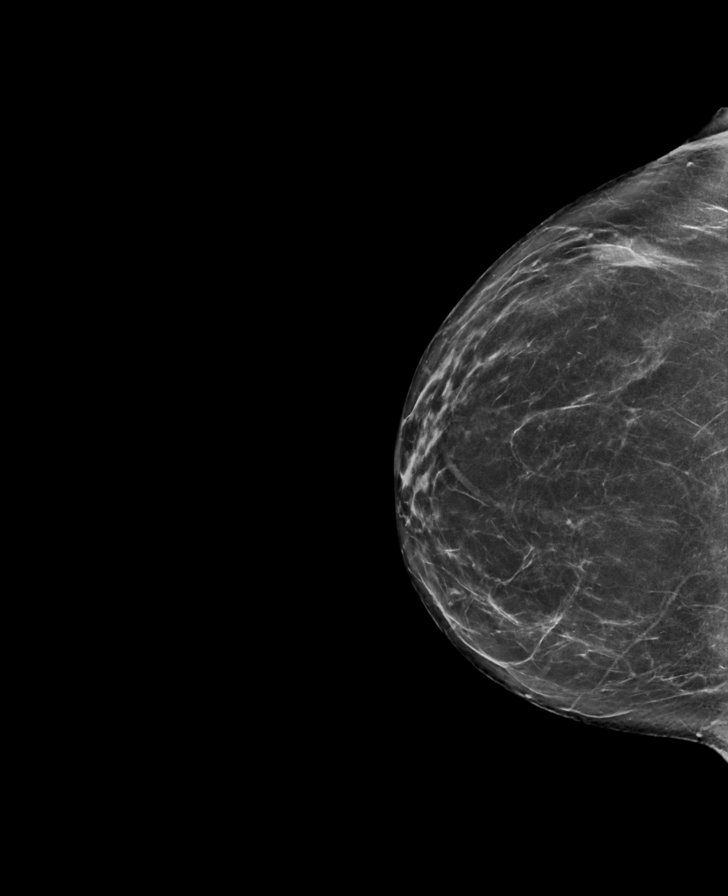

[R MLO synth-2D]
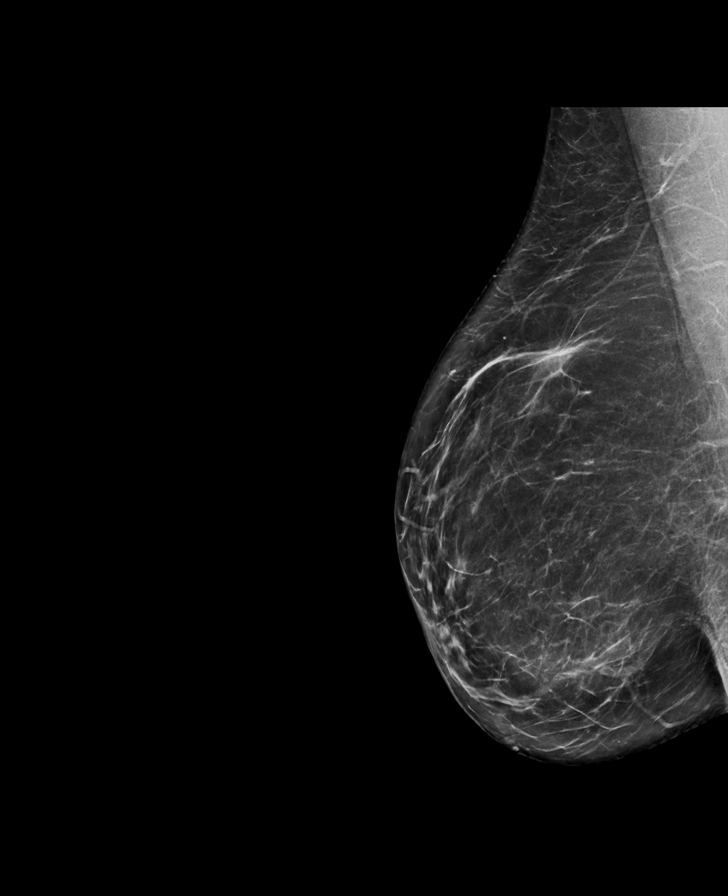

[R MLO tomo · tomo slice 39/78.0]
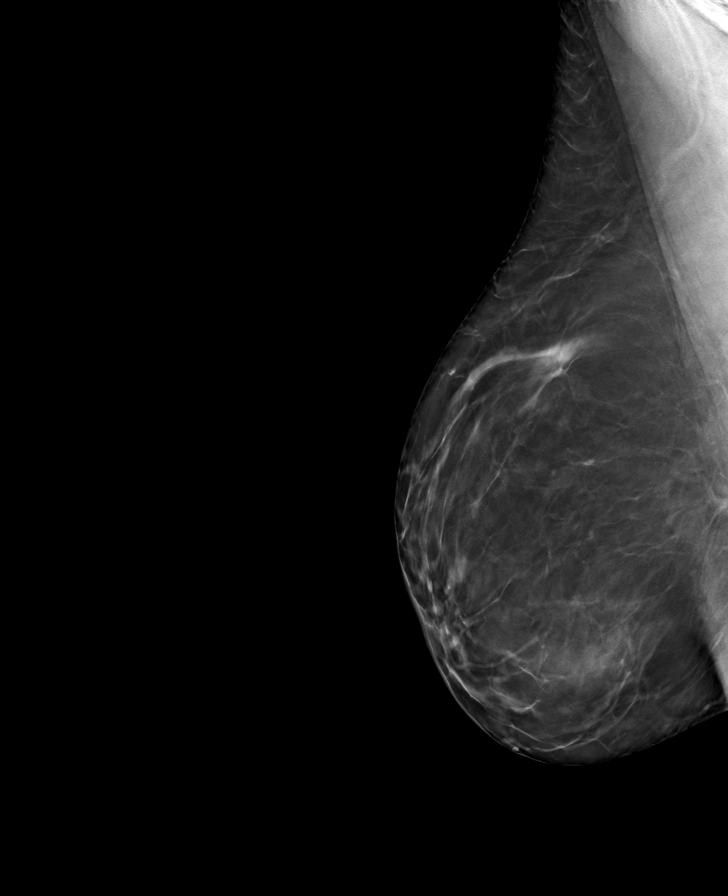

[R CC tomo · tomo slice 36/71.0]
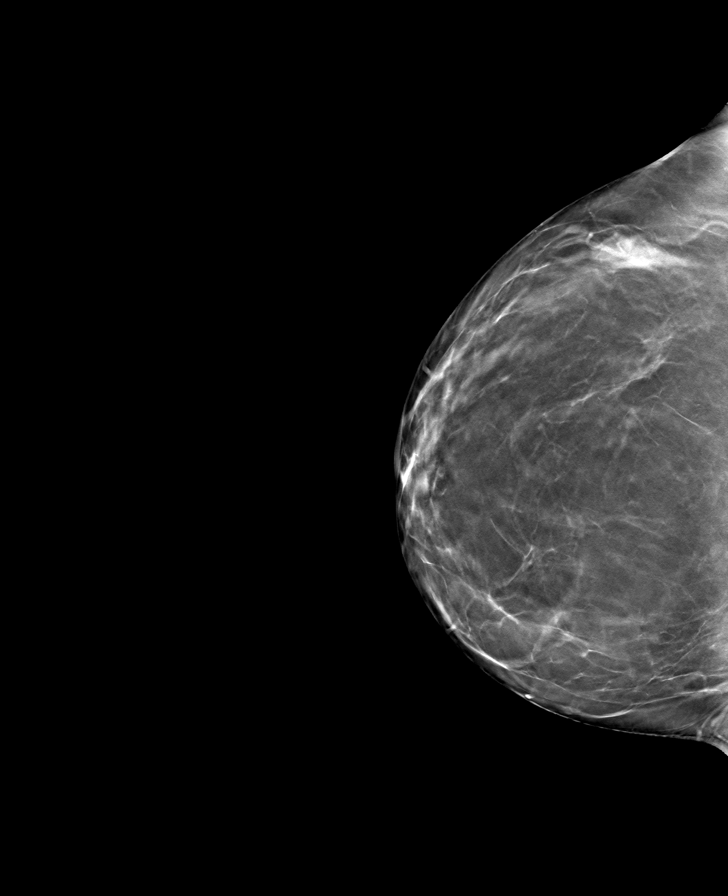

[L MLO tomo · tomo slice 39/76.0]
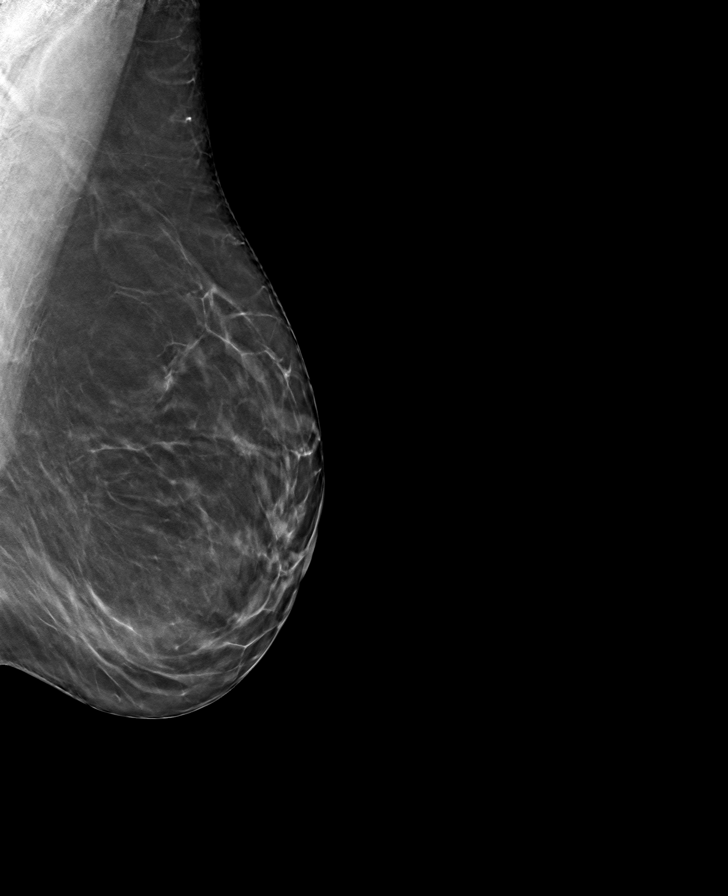

[L CC tomo · tomo slice 37/73.0]
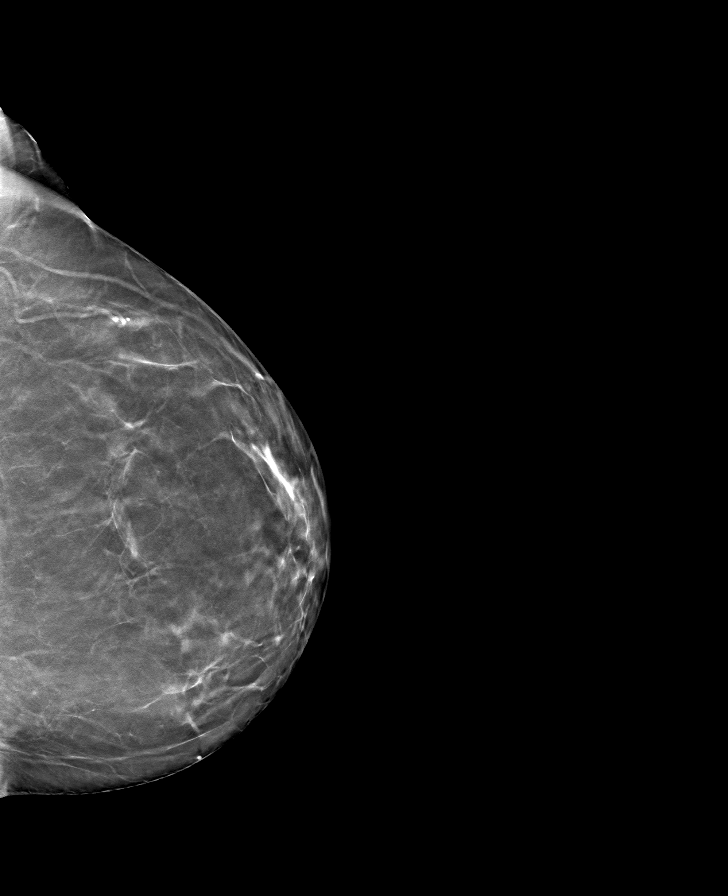

[8 of 24 positions shown; findings below may reference images not displayed]

ACR Breast Density Category b: There are scattered areas of
fibroglandular density.
FINDINGS: There are no findings suspicious for malignancy.
IMPRESSION: No mammographic evidence of malignancy. A result letter of this
screening mammogram will be mailed directly to the patient.

RECOMMENDATION:
Screening mammogram in one year. (Code:51-O-LD2)

BI-RADS CATEGORY  1: Negative.

## 2023-03-28 DIAGNOSIS — L821 Other seborrheic keratosis: Secondary | ICD-10-CM | POA: Diagnosis not present

## 2023-03-28 DIAGNOSIS — D0439 Carcinoma in situ of skin of other parts of face: Secondary | ICD-10-CM | POA: Diagnosis not present

## 2023-03-28 DIAGNOSIS — D1801 Hemangioma of skin and subcutaneous tissue: Secondary | ICD-10-CM | POA: Diagnosis not present

## 2023-03-28 DIAGNOSIS — D044 Carcinoma in situ of skin of scalp and neck: Secondary | ICD-10-CM | POA: Diagnosis not present

## 2023-03-28 DIAGNOSIS — L918 Other hypertrophic disorders of the skin: Secondary | ICD-10-CM | POA: Diagnosis not present

## 2023-04-29 ENCOUNTER — Other Ambulatory Visit (HOSPITAL_BASED_OUTPATIENT_CLINIC_OR_DEPARTMENT_OTHER): Payer: Self-pay | Admitting: Internal Medicine

## 2023-04-29 DIAGNOSIS — E7800 Pure hypercholesterolemia, unspecified: Secondary | ICD-10-CM | POA: Diagnosis not present

## 2023-04-29 DIAGNOSIS — Z0000 Encounter for general adult medical examination without abnormal findings: Secondary | ICD-10-CM | POA: Diagnosis not present

## 2023-04-29 DIAGNOSIS — Z136 Encounter for screening for cardiovascular disorders: Secondary | ICD-10-CM

## 2023-05-01 ENCOUNTER — Telehealth (HOSPITAL_BASED_OUTPATIENT_CLINIC_OR_DEPARTMENT_OTHER): Payer: Self-pay

## 2023-06-07 ENCOUNTER — Other Ambulatory Visit: Payer: Self-pay | Admitting: Internal Medicine

## 2023-06-07 DIAGNOSIS — Z1231 Encounter for screening mammogram for malignant neoplasm of breast: Secondary | ICD-10-CM

## 2023-07-17 ENCOUNTER — Ambulatory Visit
Admission: RE | Admit: 2023-07-17 | Discharge: 2023-07-17 | Disposition: A | Discharge status: Home / Self Care | Payer: BC Managed Care – PPO | Source: Ambulatory Visit | Attending: Internal Medicine

## 2023-07-17 DIAGNOSIS — Z1231 Encounter for screening mammogram for malignant neoplasm of breast: Secondary | ICD-10-CM

## 2023-07-22 DIAGNOSIS — D044 Carcinoma in situ of skin of scalp and neck: Secondary | ICD-10-CM | POA: Diagnosis not present

## 2023-07-31 DIAGNOSIS — S6991XA Unspecified injury of right wrist, hand and finger(s), initial encounter: Secondary | ICD-10-CM | POA: Diagnosis not present

## 2023-08-01 DIAGNOSIS — S61431D Puncture wound without foreign body of right hand, subsequent encounter: Secondary | ICD-10-CM | POA: Diagnosis not present

## 2024-07-07 ENCOUNTER — Other Ambulatory Visit (HOSPITAL_BASED_OUTPATIENT_CLINIC_OR_DEPARTMENT_OTHER): Payer: Self-pay | Admitting: Internal Medicine

## 2024-07-07 ENCOUNTER — Ambulatory Visit
Admission: RE | Admit: 2024-07-07 | Discharge: 2024-07-07 | Disposition: A | Discharge status: Home / Self Care | Source: Ambulatory Visit | Attending: Internal Medicine | Admitting: Internal Medicine

## 2024-07-07 ENCOUNTER — Other Ambulatory Visit: Payer: Self-pay | Admitting: Internal Medicine

## 2024-07-07 DIAGNOSIS — Z136 Encounter for screening for cardiovascular disorders: Secondary | ICD-10-CM | POA: Diagnosis not present

## 2024-07-07 DIAGNOSIS — C4492 Squamous cell carcinoma of skin, unspecified: Secondary | ICD-10-CM | POA: Diagnosis not present

## 2024-07-07 DIAGNOSIS — M545 Low back pain, unspecified: Secondary | ICD-10-CM

## 2024-07-07 DIAGNOSIS — Z0000 Encounter for general adult medical examination without abnormal findings: Secondary | ICD-10-CM | POA: Diagnosis not present

## 2024-07-07 DIAGNOSIS — M5136 Other intervertebral disc degeneration, lumbar region with discogenic back pain only: Secondary | ICD-10-CM | POA: Diagnosis not present

## 2024-07-07 DIAGNOSIS — E7800 Pure hypercholesterolemia, unspecified: Secondary | ICD-10-CM | POA: Diagnosis not present

## 2024-07-07 DIAGNOSIS — Z23 Encounter for immunization: Secondary | ICD-10-CM | POA: Diagnosis not present

## 2024-07-08 ENCOUNTER — Other Ambulatory Visit: Payer: Self-pay | Admitting: Internal Medicine

## 2024-07-08 DIAGNOSIS — Z1231 Encounter for screening mammogram for malignant neoplasm of breast: Secondary | ICD-10-CM

## 2024-07-21 ENCOUNTER — Ambulatory Visit
Admission: RE | Admit: 2024-07-21 | Discharge: 2024-07-21 | Disposition: A | Discharge status: Home / Self Care | Source: Ambulatory Visit | Attending: Internal Medicine | Admitting: Internal Medicine

## 2024-07-21 DIAGNOSIS — Z1231 Encounter for screening mammogram for malignant neoplasm of breast: Secondary | ICD-10-CM

## 2024-08-11 ENCOUNTER — Other Ambulatory Visit (HOSPITAL_BASED_OUTPATIENT_CLINIC_OR_DEPARTMENT_OTHER)

## 2024-08-17 ENCOUNTER — Ambulatory Visit (HOSPITAL_BASED_OUTPATIENT_CLINIC_OR_DEPARTMENT_OTHER)
Admission: RE | Admit: 2024-08-17 | Discharge: 2024-08-17 | Disposition: A | Discharge status: Home / Self Care | Payer: Self-pay | Source: Ambulatory Visit | Attending: Internal Medicine | Admitting: Internal Medicine

## 2024-08-17 DIAGNOSIS — Z136 Encounter for screening for cardiovascular disorders: Secondary | ICD-10-CM | POA: Insufficient documentation
